# Patient Record
Sex: Female | Born: 1980 | Hispanic: Yes | Marital: Single | Smoking: Never smoker
Health system: Southern US, Community
[De-identification: ages and names within clinical notes are randomized; demographics above are authoritative.]

---

## 2008-08-17 ENCOUNTER — Encounter: Admission: RE | Admit: 2008-08-17 | Discharge: 2008-11-02 | Payer: Self-pay | Admitting: Family Medicine

## 2008-08-17 ENCOUNTER — Ambulatory Visit: Payer: Self-pay | Admitting: Obstetrics & Gynecology

## 2008-08-24 ENCOUNTER — Ambulatory Visit: Payer: Self-pay | Admitting: Obstetrics & Gynecology

## 2008-09-07 ENCOUNTER — Ambulatory Visit: Payer: Self-pay | Admitting: Obstetrics & Gynecology

## 2008-09-07 LAB — CONVERTED CEMR LAB
HCT: 40.8 % (ref 36.0–46.0)
Hemoglobin: 12.3 g/dL (ref 12.0–15.0)
MCHC: 30.1 g/dL (ref 30.0–36.0)
MCV: 88.5 fL (ref 78.0–100.0)
Platelets: 264 10*3/uL (ref 150–400)
RDW: 14.2 % (ref 11.5–15.5)

## 2008-09-21 ENCOUNTER — Ambulatory Visit: Payer: Self-pay | Admitting: Family Medicine

## 2008-09-24 ENCOUNTER — Ambulatory Visit (HOSPITAL_COMMUNITY): Admission: RE | Admit: 2008-09-24 | Discharge: 2008-09-24 | Payer: Self-pay | Admitting: Family Medicine

## 2008-09-24 ENCOUNTER — Ambulatory Visit: Payer: Self-pay | Admitting: Family Medicine

## 2008-09-28 ENCOUNTER — Ambulatory Visit: Payer: Self-pay | Admitting: Obstetrics & Gynecology

## 2008-10-01 ENCOUNTER — Ambulatory Visit: Payer: Self-pay | Admitting: Family Medicine

## 2008-10-05 ENCOUNTER — Ambulatory Visit: Payer: Self-pay | Admitting: Obstetrics & Gynecology

## 2008-10-08 ENCOUNTER — Ambulatory Visit: Payer: Self-pay | Admitting: Family Medicine

## 2008-10-12 ENCOUNTER — Ambulatory Visit: Payer: Self-pay | Admitting: Family Medicine

## 2008-10-15 ENCOUNTER — Ambulatory Visit: Payer: Self-pay | Admitting: Obstetrics & Gynecology

## 2008-10-19 ENCOUNTER — Ambulatory Visit: Payer: Self-pay | Admitting: Obstetrics & Gynecology

## 2008-10-22 ENCOUNTER — Ambulatory Visit: Payer: Self-pay | Admitting: Obstetrics & Gynecology

## 2008-10-26 ENCOUNTER — Ambulatory Visit: Payer: Self-pay | Admitting: Obstetrics & Gynecology

## 2008-10-26 ENCOUNTER — Encounter: Payer: Self-pay | Admitting: Obstetrics & Gynecology

## 2008-10-26 LAB — CONVERTED CEMR LAB
Chlamydia, DNA Probe: NEGATIVE
GC Probe Amp, Genital: NEGATIVE

## 2008-10-27 ENCOUNTER — Encounter: Payer: Self-pay | Admitting: Obstetrics & Gynecology

## 2008-10-29 ENCOUNTER — Ambulatory Visit: Payer: Self-pay | Admitting: Obstetrics & Gynecology

## 2008-11-02 ENCOUNTER — Ambulatory Visit: Payer: Self-pay | Admitting: Obstetrics & Gynecology

## 2008-11-05 ENCOUNTER — Ambulatory Visit: Payer: Self-pay | Admitting: Obstetrics & Gynecology

## 2008-11-05 ENCOUNTER — Ambulatory Visit (HOSPITAL_COMMUNITY): Admission: RE | Admit: 2008-11-05 | Discharge: 2008-11-05 | Payer: Self-pay | Admitting: Obstetrics & Gynecology

## 2008-11-09 ENCOUNTER — Ambulatory Visit: Payer: Self-pay | Admitting: Obstetrics & Gynecology

## 2008-11-10 ENCOUNTER — Inpatient Hospital Stay (HOSPITAL_COMMUNITY): Admission: AD | Admit: 2008-11-10 | Discharge: 2008-11-12 | Payer: Self-pay | Admitting: Family Medicine

## 2008-11-10 ENCOUNTER — Ambulatory Visit: Payer: Self-pay | Admitting: Family Medicine

## 2009-06-18 IMAGING — US US OB DETAIL+14 WK
1 series · 14 of 28 positions shown · non-contrast
Comparison: none

OBSTETRICAL ULTRASOUND:
 This ultrasound exam was performed in the [HOSPITAL] Ultrasound Department.  The OB US report was generated in the AS system, and faxed to the ordering physician.  This report is also available in [REDACTED] PACS.

[Series 1: us ob detail +14 wk · 14 of 75 slices shown]
[im 3/75]
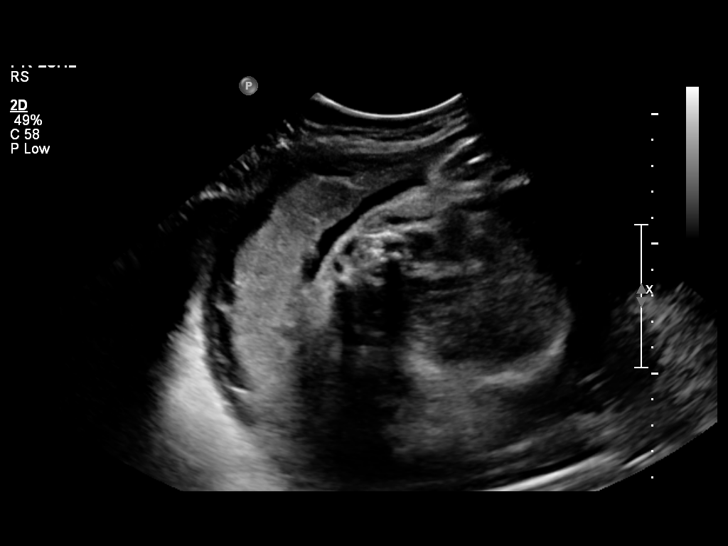
[im 9/75]
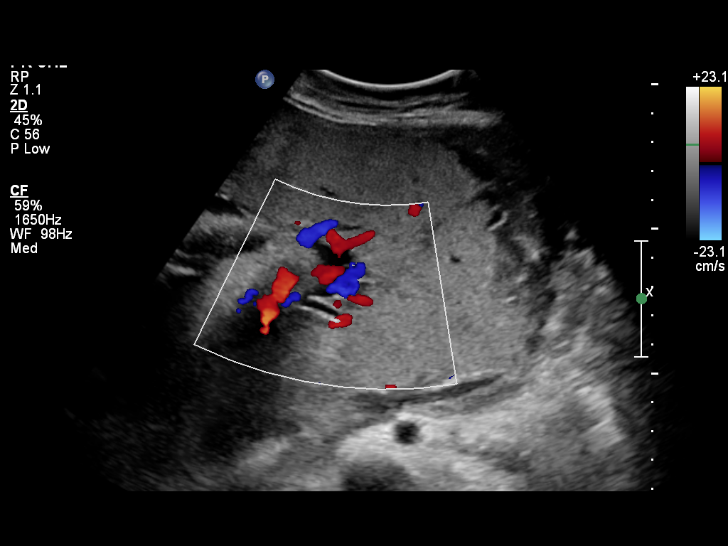
[im 14/75]
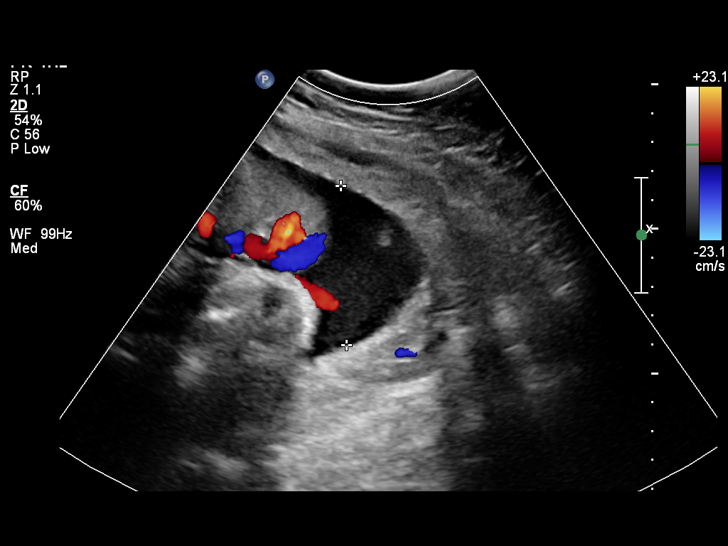
[im 20/75]
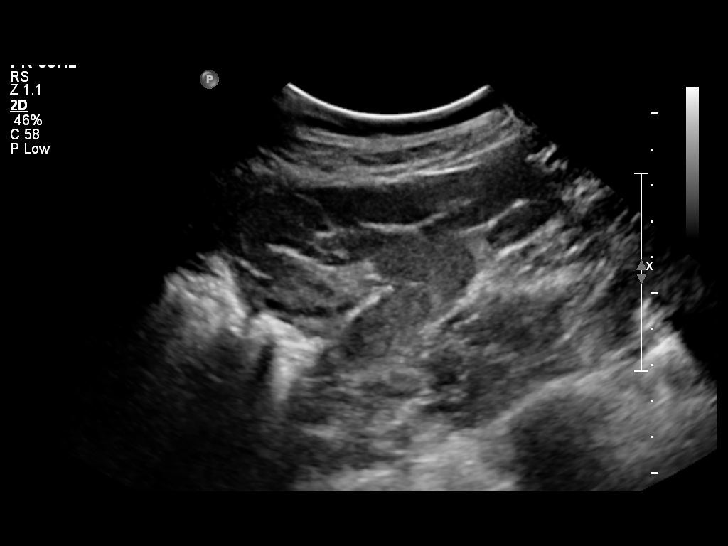
[im 25/75]
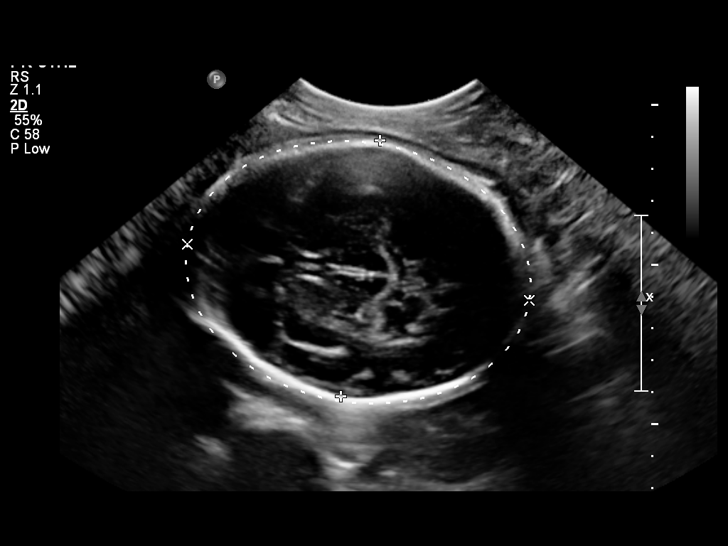
[im 31/75]
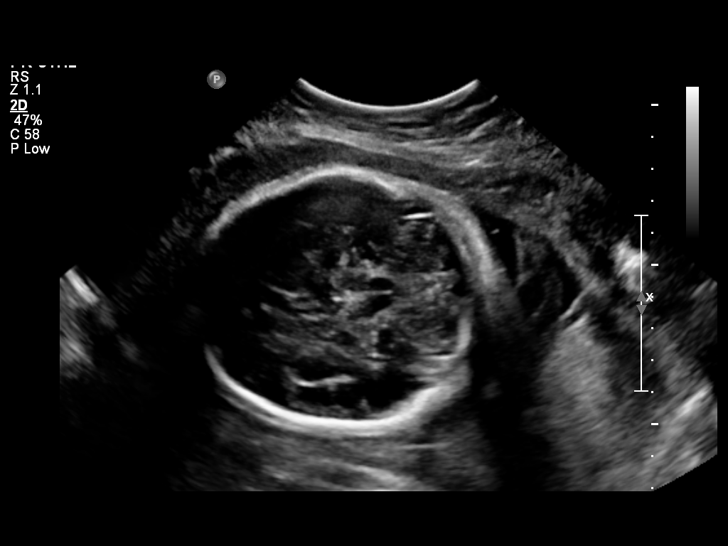
[im 36/75]
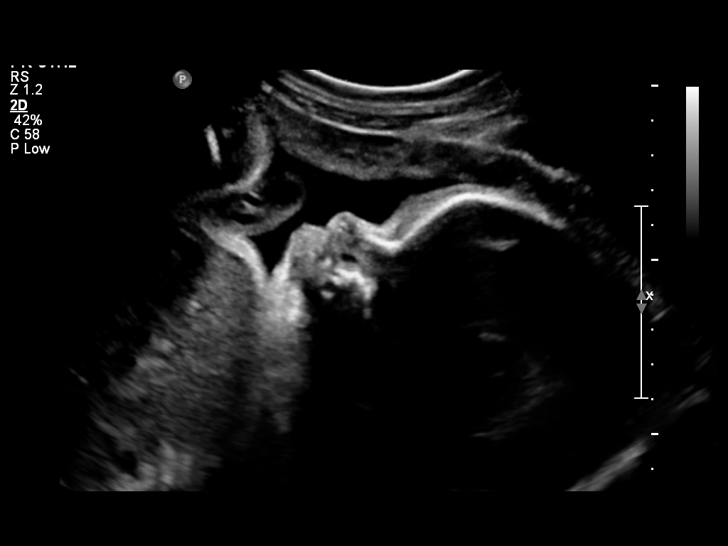
[im 42/75]
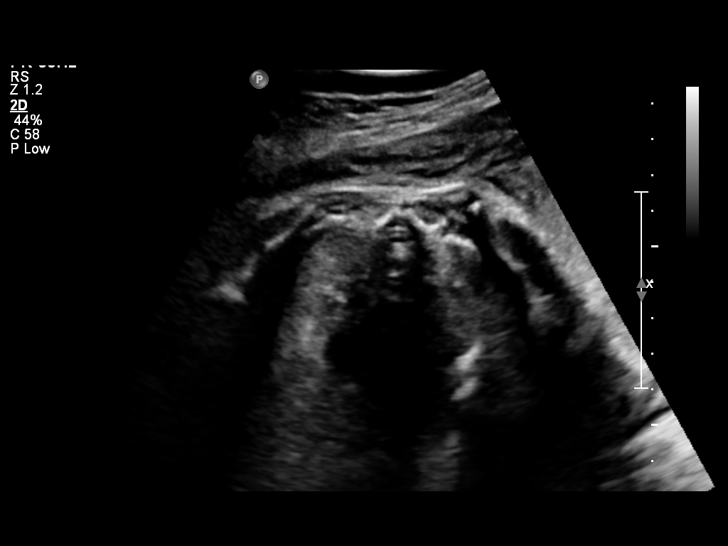
[im 47/75]
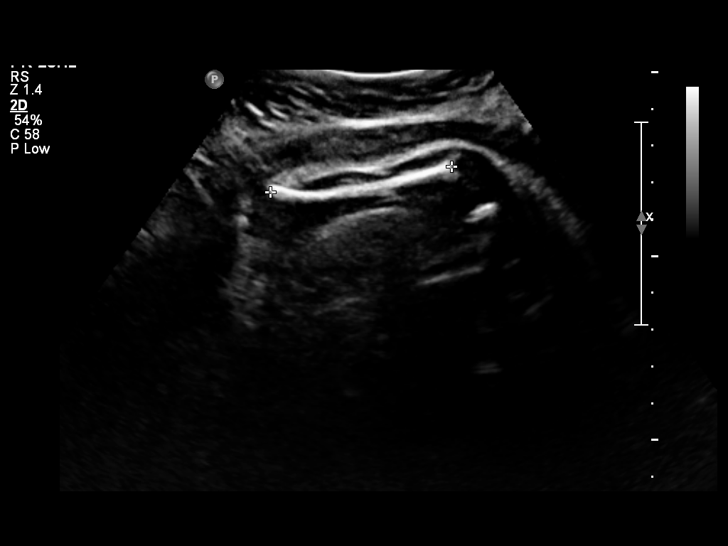
[im 53/75]
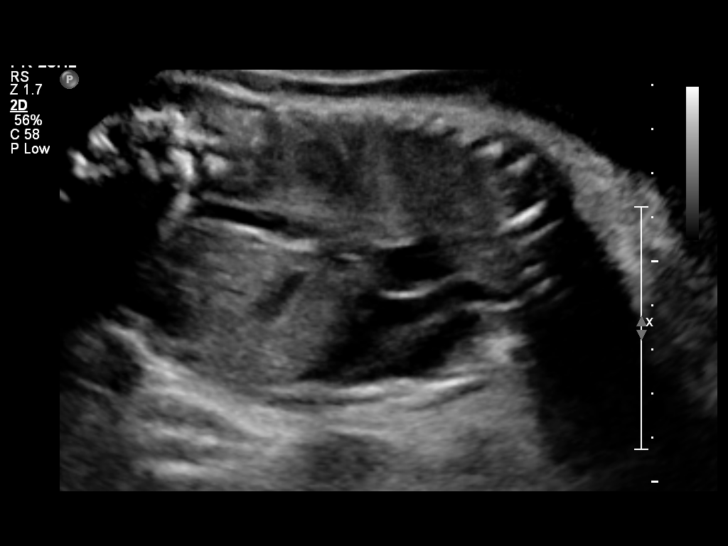
[im 58/75]
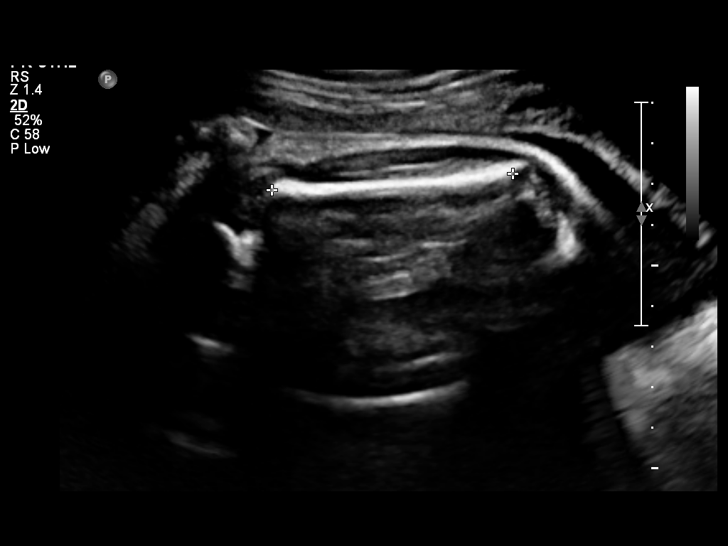
[im 64/75]
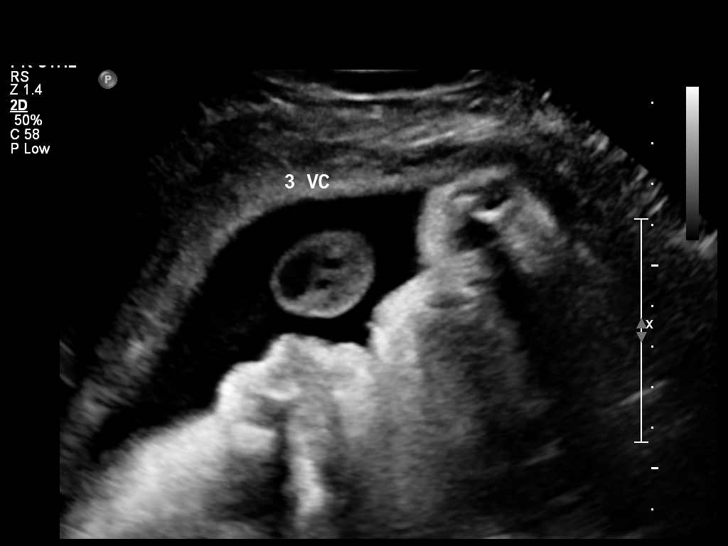
[im 69/75]
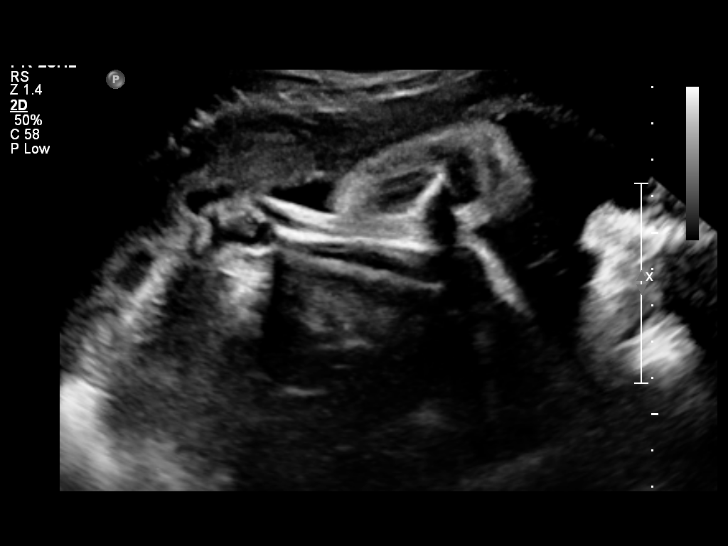
[im 75/75]
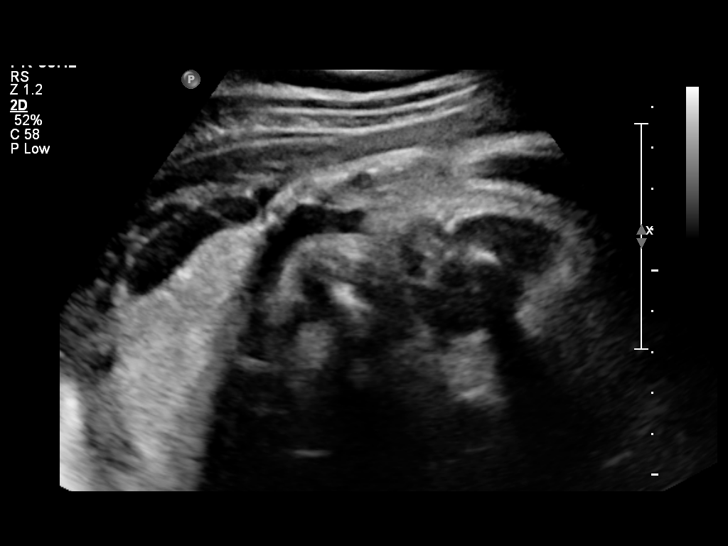

[14 of 28 positions shown; findings below may reference images not displayed]

IMPRESSION: See AS Obstetric US report.

## 2010-08-14 ENCOUNTER — Encounter: Payer: Self-pay | Admitting: *Deleted

## 2010-11-02 LAB — POCT URINALYSIS DIP (DEVICE)
Bilirubin Urine: NEGATIVE
Bilirubin Urine: NEGATIVE
Glucose, UA: NEGATIVE mg/dL
Glucose, UA: NEGATIVE mg/dL
Hgb urine dipstick: NEGATIVE
Hgb urine dipstick: NEGATIVE
Ketones, ur: NEGATIVE mg/dL
Ketones, ur: NEGATIVE mg/dL
Protein, ur: NEGATIVE mg/dL
Protein, ur: 30 mg/dL — AB
Specific Gravity, Urine: 1.02 (ref 1.005–1.030)
Specific Gravity, Urine: 1.02 (ref 1.005–1.030)
Specific Gravity, Urine: 1.025 (ref 1.005–1.030)

## 2010-11-02 LAB — GLUCOSE, CAPILLARY
Glucose-Capillary: 139 mg/dL — ABNORMAL HIGH (ref 70–99)
Glucose-Capillary: 87 mg/dL (ref 70–99)
Glucose-Capillary: 87 mg/dL (ref 70–99)

## 2010-11-02 LAB — CBC
HCT: 42.2 % (ref 36.0–46.0)
Hemoglobin: 13.7 g/dL (ref 12.0–15.0)
Platelets: 201 10*3/uL (ref 150–400)
RBC: 4.9 MIL/uL (ref 3.87–5.11)
WBC: 8.3 10*3/uL (ref 4.0–10.5)

## 2010-11-03 LAB — POCT URINALYSIS DIP (DEVICE)
Bilirubin Urine: NEGATIVE
Bilirubin Urine: NEGATIVE
Hgb urine dipstick: NEGATIVE
Hgb urine dipstick: NEGATIVE
Hgb urine dipstick: NEGATIVE
Ketones, ur: NEGATIVE mg/dL
Ketones, ur: NEGATIVE mg/dL
Ketones, ur: NEGATIVE mg/dL
Nitrite: NEGATIVE
Protein, ur: 30 mg/dL — AB
Protein, ur: NEGATIVE mg/dL
Protein, ur: NEGATIVE mg/dL
Specific Gravity, Urine: 1.02 (ref 1.005–1.030)
Specific Gravity, Urine: 1.015 (ref 1.005–1.030)
Urobilinogen, UA: 0.2 mg/dL (ref 0.0–1.0)
Urobilinogen, UA: 0.2 mg/dL (ref 0.0–1.0)
pH: 7 (ref 5.0–8.0)
pH: 7 (ref 5.0–8.0)
pH: 6.5 (ref 5.0–8.0)
pH: 7 (ref 5.0–8.0)

## 2010-11-07 LAB — POCT URINALYSIS DIP (DEVICE)
Bilirubin Urine: NEGATIVE
Glucose, UA: NEGATIVE mg/dL
Hgb urine dipstick: NEGATIVE
Ketones, ur: NEGATIVE mg/dL
Nitrite: NEGATIVE
Protein, ur: NEGATIVE mg/dL
Specific Gravity, Urine: <=1.005 (ref 1.005–1.030)
Urobilinogen, UA: 0.2 mg/dL (ref 0.0–1.0)
pH: 6 (ref 5.0–8.0)

## 2010-11-08 LAB — POCT URINALYSIS DIP (DEVICE)
Bilirubin Urine: NEGATIVE
Glucose, UA: NEGATIVE mg/dL
Hgb urine dipstick: NEGATIVE
Nitrite: NEGATIVE
Nitrite: NEGATIVE
Protein, ur: 30 mg/dL — AB
Protein, ur: NEGATIVE mg/dL
Urobilinogen, UA: 0.2 mg/dL (ref 0.0–1.0)
Urobilinogen, UA: 1 mg/dL (ref 0.0–1.0)
pH: 6 (ref 5.0–8.0)
pH: 8 (ref 5.0–8.0)

## 2017-12-14 ENCOUNTER — Emergency Department (HOSPITAL_BASED_OUTPATIENT_CLINIC_OR_DEPARTMENT_OTHER)
Admission: EM | Admit: 2017-12-14 | Discharge: 2017-12-14 | Disposition: A | Discharge status: Home / Self Care | Payer: Self-pay | Attending: Emergency Medicine | Admitting: Emergency Medicine

## 2017-12-14 ENCOUNTER — Other Ambulatory Visit: Payer: Self-pay

## 2017-12-14 ENCOUNTER — Encounter (HOSPITAL_BASED_OUTPATIENT_CLINIC_OR_DEPARTMENT_OTHER): Payer: Self-pay | Admitting: *Deleted

## 2017-12-14 ENCOUNTER — Emergency Department (HOSPITAL_BASED_OUTPATIENT_CLINIC_OR_DEPARTMENT_OTHER): Payer: Self-pay

## 2017-12-14 DIAGNOSIS — R0781 Pleurodynia: Secondary | ICD-10-CM | POA: Insufficient documentation

## 2017-12-14 DIAGNOSIS — R079 Chest pain, unspecified: Secondary | ICD-10-CM | POA: Insufficient documentation

## 2017-12-14 LAB — COMPREHENSIVE METABOLIC PANEL
ALK PHOS: 103 U/L (ref 38–126)
ALT: 35 U/L (ref 14–54)
ANION GAP: 9 (ref 5–15)
AST: 45 U/L — ABNORMAL HIGH (ref 15–41)
Albumin: 4 g/dL (ref 3.5–5.0)
BILIRUBIN TOTAL: 0.6 mg/dL (ref 0.3–1.2)
BUN: 9 mg/dL (ref 6–20)
CALCIUM: 9.2 mg/dL (ref 8.9–10.3)
CO2: 24 mmol/L (ref 22–32)
CREATININE: 0.63 mg/dL (ref 0.44–1.00)
Chloride: 103 mmol/L (ref 101–111)
GFR calc non Af Amer: >60 mL/min (ref >60)
Glucose, Bld: 155 mg/dL — ABNORMAL HIGH (ref 65–99)
Potassium: 3.8 mmol/L (ref 3.5–5.1)
Sodium: 136 mmol/L (ref 135–145)
TOTAL PROTEIN: 7.7 g/dL (ref 6.5–8.1)

## 2017-12-14 LAB — CBC WITH DIFFERENTIAL/PLATELET
BASOS ABS: 0 10*3/uL (ref 0.0–0.1)
BASOS PCT: 0 %
EOS ABS: 0.3 10*3/uL (ref 0.0–0.7)
Eosinophils Relative: 3 %
HEMATOCRIT: 39.4 % (ref 36.0–46.0)
HEMOGLOBIN: 13 g/dL (ref 12.0–15.0)
Lymphocytes Relative: 20 %
Lymphs Abs: 2 10*3/uL (ref 0.7–4.0)
MCH: 28.4 pg (ref 26.0–34.0)
MCHC: 33 g/dL (ref 30.0–36.0)
MCV: 86.2 fL (ref 78.0–100.0)
MONO ABS: 0.5 10*3/uL (ref 0.1–1.0)
Monocytes Relative: 5 %
NEUTROS ABS: 7.2 10*3/uL (ref 1.7–7.7)
NEUTROS PCT: 72 %
Platelets: 363 10*3/uL (ref 150–400)
RBC: 4.57 MIL/uL (ref 3.87–5.11)
RDW: 13.2 % (ref 11.5–15.5)
WBC: 10 10*3/uL (ref 4.0–10.5)

## 2017-12-14 LAB — PREGNANCY, URINE: Preg Test, Ur: NEGATIVE

## 2017-12-14 LAB — D-DIMER, QUANTITATIVE (NOT AT ARMC): D DIMER QUANT: 0.44 ug{FEU}/mL (ref 0.00–0.50)

## 2017-12-14 LAB — TROPONIN I

## 2017-12-14 MED ORDER — NAPROXEN 500 MG PO TABS: 500.0000 mg | ORAL_TABLET | Freq: Two times a day (BID) | ORAL | 0 refills | Status: DC

## 2017-12-14 MED FILL — NAPROXEN 500 MG TABLET: 500 | 10 days supply | Qty: 20 | Fill #0

## 2017-12-14 NOTE — ED Notes (Signed)
Pt on monitor 

## 2017-12-14 NOTE — ED Triage Notes (Addendum)
Pt co mid back pain x 1 week  Cough

## 2017-12-14 NOTE — ED Provider Notes (Signed)
[facility] EMERGENCY DEPARTMENT Provider Note   CSN: [id number] Arrival date & time: 12/14/17  1338     History   Chief Complaint Chief Complaint  Patient presents with  . Back Pain    HPI Brittany Jefferson is a 37 y.o. female.  The history is provided by the patient and a relative. No language interpreter was used.  Back Pain      Brittany Jefferson is a 37 y.o. female who presents to the Emergency Department complaining of back pain/chest pain. She reports one week of atraumatic left sided back pain and chest pain. Pain is described as a sharp and pressure likes sensation. It is constant but worse with deep breaths and worse with coughing. She denies any fevers, nausea, vomiting, abdominal pain, leg swelling or pain. No prior similar symptoms. She takes no medications and has no medical problems. No recent surgeries or travel.  History reviewed. No pertinent past medical history.  There are no active problems to display for this patient.   History reviewed. No pertinent surgical history.   OB History   None      Home Medications    Prior to Admission medications   Medication Sig Start Date End Date Taking? Authorizing Provider  naproxen (NAPROSYN) 500 MG tablet Take 1 tablet (500 mg total) by mouth 2 (two) times daily. 12/14/17   Tilden Fossa, MD    Family History History reviewed. No pertinent family history.  Social History Social History   Tobacco Use  . Smoking status: Never Smoker  . Smokeless tobacco: Never Used  Substance Use Topics  . Alcohol use: Not Currently  . Drug use: Never     Allergies   Patient has no known allergies.   Review of Systems Review of Systems  Musculoskeletal: Positive for back pain.  All other systems reviewed and are negative.    Physical Exam Updated Vital Signs BP 127/83 (BP Location: Right Arm)   Pulse 99   Temp 99.5 F (37.5 C) (Oral)   Resp 18   Wt 66.2 kg (146 lb)   LMP 12/03/2017    SpO2 97%   Physical Exam  Constitutional: She is oriented to person, place, and time. She appears well-developed and well-nourished.  HENT:  Head: Normocephalic and atraumatic.  Cardiovascular: Normal rate and regular rhythm.  No murmur heard. Pulmonary/Chest: Effort normal and breath sounds normal. No respiratory distress. She exhibits no tenderness.  Abdominal: Soft. There is no tenderness. There is no rebound and no guarding.  Musculoskeletal: She exhibits no edema or tenderness.  Neurological: She is alert and oriented to person, place, and time.  Skin: Skin is warm and dry. No rash noted.  Psychiatric: She has a normal mood and affect. Her behavior is normal.  Nursing note and vitals reviewed.    ED Treatments / Results  Labs (all labs ordered are listed, but only abnormal results are displayed) Labs Reviewed  COMPREHENSIVE METABOLIC PANEL - Abnormal; Notable for the following components:      Result Value   Glucose, Bld 155 (*)    AST 45 (*)    All other components within normal limits  PREGNANCY, URINE  CBC WITH DIFFERENTIAL/PLATELET  TROPONIN I  D-DIMER, QUANTITATIVE (NOT AT [facility])    EKG EKG Interpretation  Date/Time:  Friday Dec 14 2017 14:46:58 EDT Ventricular Rate:  86 PR Interval:    QRS Duration: 78 QT Interval:  352 QTC Calculation: 421 R Axis:   63 Text Interpretation:  Sinus rhythm  Borderline short PR interval Borderline T wave abnormalities no prior available for comparison Confirmed by Tilden Fossa [phone]) on 12/14/2017 2:50:54 PM   Radiology Dg Chest 2 View  Result Date: 12/14/2017 CLINICAL DATA:  Cough and congestion for 1 week. EXAM: CHEST - 2 VIEW COMPARISON:  None. FINDINGS: The heart size and mediastinal contours are within normal limits. Both lungs are clear. The visualized skeletal structures are unremarkable. IMPRESSION: No active cardiopulmonary disease. Electronically Signed   By: Marin Roberts M.D.   On: 12/14/2017 14:12     Procedures Procedures (including critical care time)  Medications Ordered in ED Medications - No data to display   Initial Impression / Assessment and Plan / ED Course  I have reviewed the triage vital signs and the nursing notes.  Pertinent labs & imaging results that were available during my care of the patient were reviewed by me and considered in my medical decision making (see chart for details).     Patient here for evaluation of the left sided chest and back pain. She is non-toxic appearing on examination. She is low risk for DVT and D dimer is negative. No clinical or radiographic evidence of pneumonia. Presentation is not consistent with ACS, CHF, dissection. Discussed with patient home care for pleuritic chest pain. Discussed outpatient follow-up and return precautions.  Final Clinical Impressions(s) / ED Diagnoses   Final diagnoses:  Pleuritic chest pain    ED Discharge Orders        Ordered    naproxen (NAPROSYN) 500 MG tablet  2 times daily     12/14/17 1536       Tilden Fossa, MD 12/14/17 1555

## 2017-12-14 NOTE — ED Notes (Signed)
Patient transported to X-ray 

## 2018-01-15 ENCOUNTER — Emergency Department (HOSPITAL_BASED_OUTPATIENT_CLINIC_OR_DEPARTMENT_OTHER)
Admission: EM | Admit: 2018-01-15 | Discharge: 2018-01-15 | Disposition: A | Discharge status: Home / Self Care | Payer: Self-pay | Attending: Emergency Medicine | Admitting: Emergency Medicine

## 2018-01-15 ENCOUNTER — Encounter (HOSPITAL_BASED_OUTPATIENT_CLINIC_OR_DEPARTMENT_OTHER): Payer: Self-pay

## 2018-01-15 ENCOUNTER — Other Ambulatory Visit: Payer: Self-pay

## 2018-01-15 ENCOUNTER — Emergency Department (HOSPITAL_BASED_OUTPATIENT_CLINIC_OR_DEPARTMENT_OTHER): Payer: Self-pay

## 2018-01-15 DIAGNOSIS — R05 Cough: Secondary | ICD-10-CM | POA: Insufficient documentation

## 2018-01-15 DIAGNOSIS — R079 Chest pain, unspecified: Secondary | ICD-10-CM | POA: Insufficient documentation

## 2018-01-15 DIAGNOSIS — M940 Chondrocostal junction syndrome [Tietze]: Secondary | ICD-10-CM | POA: Insufficient documentation

## 2018-01-15 MED ORDER — NAPROXEN 500 MG PO TABS: 500.0000 mg | ORAL_TABLET | Freq: Two times a day (BID) | ORAL | 0 refills | Status: AC

## 2018-01-15 MED ORDER — BENZONATATE 100 MG PO CAPS: 100.0000 mg | ORAL_CAPSULE | Freq: Three times a day (TID) | ORAL | 0 refills | Status: AC

## 2018-01-15 NOTE — ED Triage Notes (Signed)
Pt c/o pain under lt breast through to back; pain to take a deep breath

## 2018-01-15 NOTE — ED Provider Notes (Signed)
[facility] EMERGENCY DEPARTMENT Provider Note  CSN: [id number] Arrival date & time: 01/15/18  1817    History   Chief Complaint Chief Complaint  Patient presents with  . Back Pain    HPI Brittany Jefferson is a 37 y.o. female with no significant medical history who presented to the ED left sided chestbreast and back pain x3 days. Denies any recent falls, injuries or trauma to the area. Patient describes intermittent pain that is worse with movement and is pleuritic in nature. Denies chest pain, SOB, leg swelling and palpitations. Patient reports coughing excessively over the last week due to upper respiratory infection.    Additional history obtained by medical chart. Patient seen in the ED on 12/14/17 for similar complaints.   History reviewed. No pertinent past medical history.  There are no active problems to display for this patient.   History reviewed. No pertinent surgical history.   OB History   None      Home Medications    Prior to Admission medications   Medication Sig Start Date End Date Taking? Authorizing Provider  benzonatate (TESSALON) 100 MG capsule Take 1 capsule (100 mg total) by mouth 3 (three) times daily for 7 days. 01/15/18 01/22/18  Mortis, Jerrel Ivory I, PA-C  naproxen (NAPROSYN) 500 MG tablet Take 1 tablet (500 mg total) by mouth 2 (two) times daily for 14 days. 01/15/18 01/29/18  Mortis, Sharyon Medicus, PA-C    Family History No family history on file.  Social History Social History   Tobacco Use  . Smoking status: Never Smoker  . Smokeless tobacco: Never Used  Substance Use Topics  . Alcohol use: Not Currently  . Drug use: Never     Allergies   Patient has no known allergies.   Review of Systems Review of Systems  Respiratory: Positive for cough. Negative for chest tightness, shortness of breath and wheezing.   Cardiovascular: Positive for chest pain. Negative for palpitations and leg swelling.  Musculoskeletal: Positive for  back pain. Negative for neck pain and neck stiffness.  Skin: Negative.   Neurological: Negative.      Physical Exam Updated Vital Signs BP 125/75 (BP Location: Right Arm)   Pulse 89   Temp 98.9 F (37.2 C) (Oral)   Resp 18   Ht 5' (1.524 m)   Wt 64.1 kg (141 lb 6.4 oz)   LMP 12/27/2017   SpO2 100%   BMI 27.62 kg/m   Physical Exam  Constitutional: She appears well-developed and well-nourished. No distress.  Cardiovascular: Normal rate, regular rhythm, normal heart sounds and intact distal pulses.  Pulmonary/Chest: Effort normal and breath sounds normal. Chest wall is not dull to percussion. She exhibits tenderness and bony tenderness.    Musculoskeletal:       Cervical back: Normal.       Thoracic back: Normal.       Lumbar back: Normal.  Neurological: She has normal strength and normal reflexes. No sensory deficit. She exhibits normal muscle tone.  Skin: Skin is warm and intact. Capillary refill takes less than 2 seconds. No bruising and no ecchymosis noted. No erythema.  Nursing note and vitals reviewed.    ED Treatments / Results  Labs (all labs ordered are listed, but only abnormal results are displayed) Labs Reviewed - No data to display  EKG None  Radiology Dg Chest 2 View  Result Date: 01/15/2018 CLINICAL DATA:  Chest pressure x3 days EXAM: CHEST - 2 VIEW COMPARISON:  12/14/2017 FINDINGS: The heart size  and mediastinal contours are within normal limits. Both lungs are clear. The visualized skeletal structures are unremarkable. IMPRESSION: No active cardiopulmonary disease. Electronically Signed   By: Tollie Ethavid  Kwon M.D.   On: 01/15/2018 19:32    Procedures Procedures (including critical care time)  Medications Ordered in ED Medications - No data to display   Initial Impression / Assessment and Plan / ED Course  Triage vital signs and the nursing notes have been reviewed.  Pertinent labs & imaging results that were available during care of the patient  were reviewed and considered in medical decision making (see chart for details).   Patient presents well-appearing and in no acute distress. Despite endorsing chest and back pain, physical exam was unremarkable except for chest wall tenderness under the left breast. No back pain on exam with normal strength, sensation and ROM in back and all extremities.  EKG and CXR were normal which is helpful in ruling an acute cardiac or pulmonary process. Patient has minimal risk factors for DVT/PE and has Wells score of 0. Patient seen for same complaint in 11/2017. She has had no acute changes in her complaints since that visit. Given history and physical exam findings, chest pain likely MSK etiology.   Final Clinical Impressions(s) / ED Diagnoses  1. Costochondritis. Naproxen 500mg  BID for pain relief. Education provided on OTC and supportive treatment for relief. Patient encouraged to establish care with PCP to discuss non-emergent medical concerns and for follow-up with respiratory symptoms.  Dispo: Home. After thorough clinical evaluation, this patient is determined to be medically stable and can be safely discharged with the previously mentioned treatment and/or outpatient follow-up/referral(s). At this time, there are no other apparent medical conditions that require further screening, evaluation or treatment.  Final diagnoses:  Costochondritis    ED Discharge Orders        Ordered    naproxen (NAPROSYN) 500 MG tablet  2 times daily     01/15/18 1938    benzonatate (TESSALON) 100 MG capsule  3 times daily     01/15/18 1945        Mortis, Roanna RaiderGabrielle I, PA-C 01/15/18 1954    Melene PlanFloyd, Dan, DO 01/15/18 2031

## 2018-01-15 NOTE — ED Triage Notes (Signed)
States pain free while sitting, pain on movement

## 2018-01-15 NOTE — Discharge Instructions (Addendum)
Follow-up with a PCP to establish regular medical care. You may also use heat or cold packs to the painful areas for additional relief in addition to the Naproxen.

## 2018-09-07 IMAGING — DX DG CHEST 2V
2 series · 2 of 2 positions shown · non-contrast
Comparison: None.

CLINICAL DATA: Cough and congestion for 1 week.

EXAM:
CHEST - 2 VIEW

[chest pa]
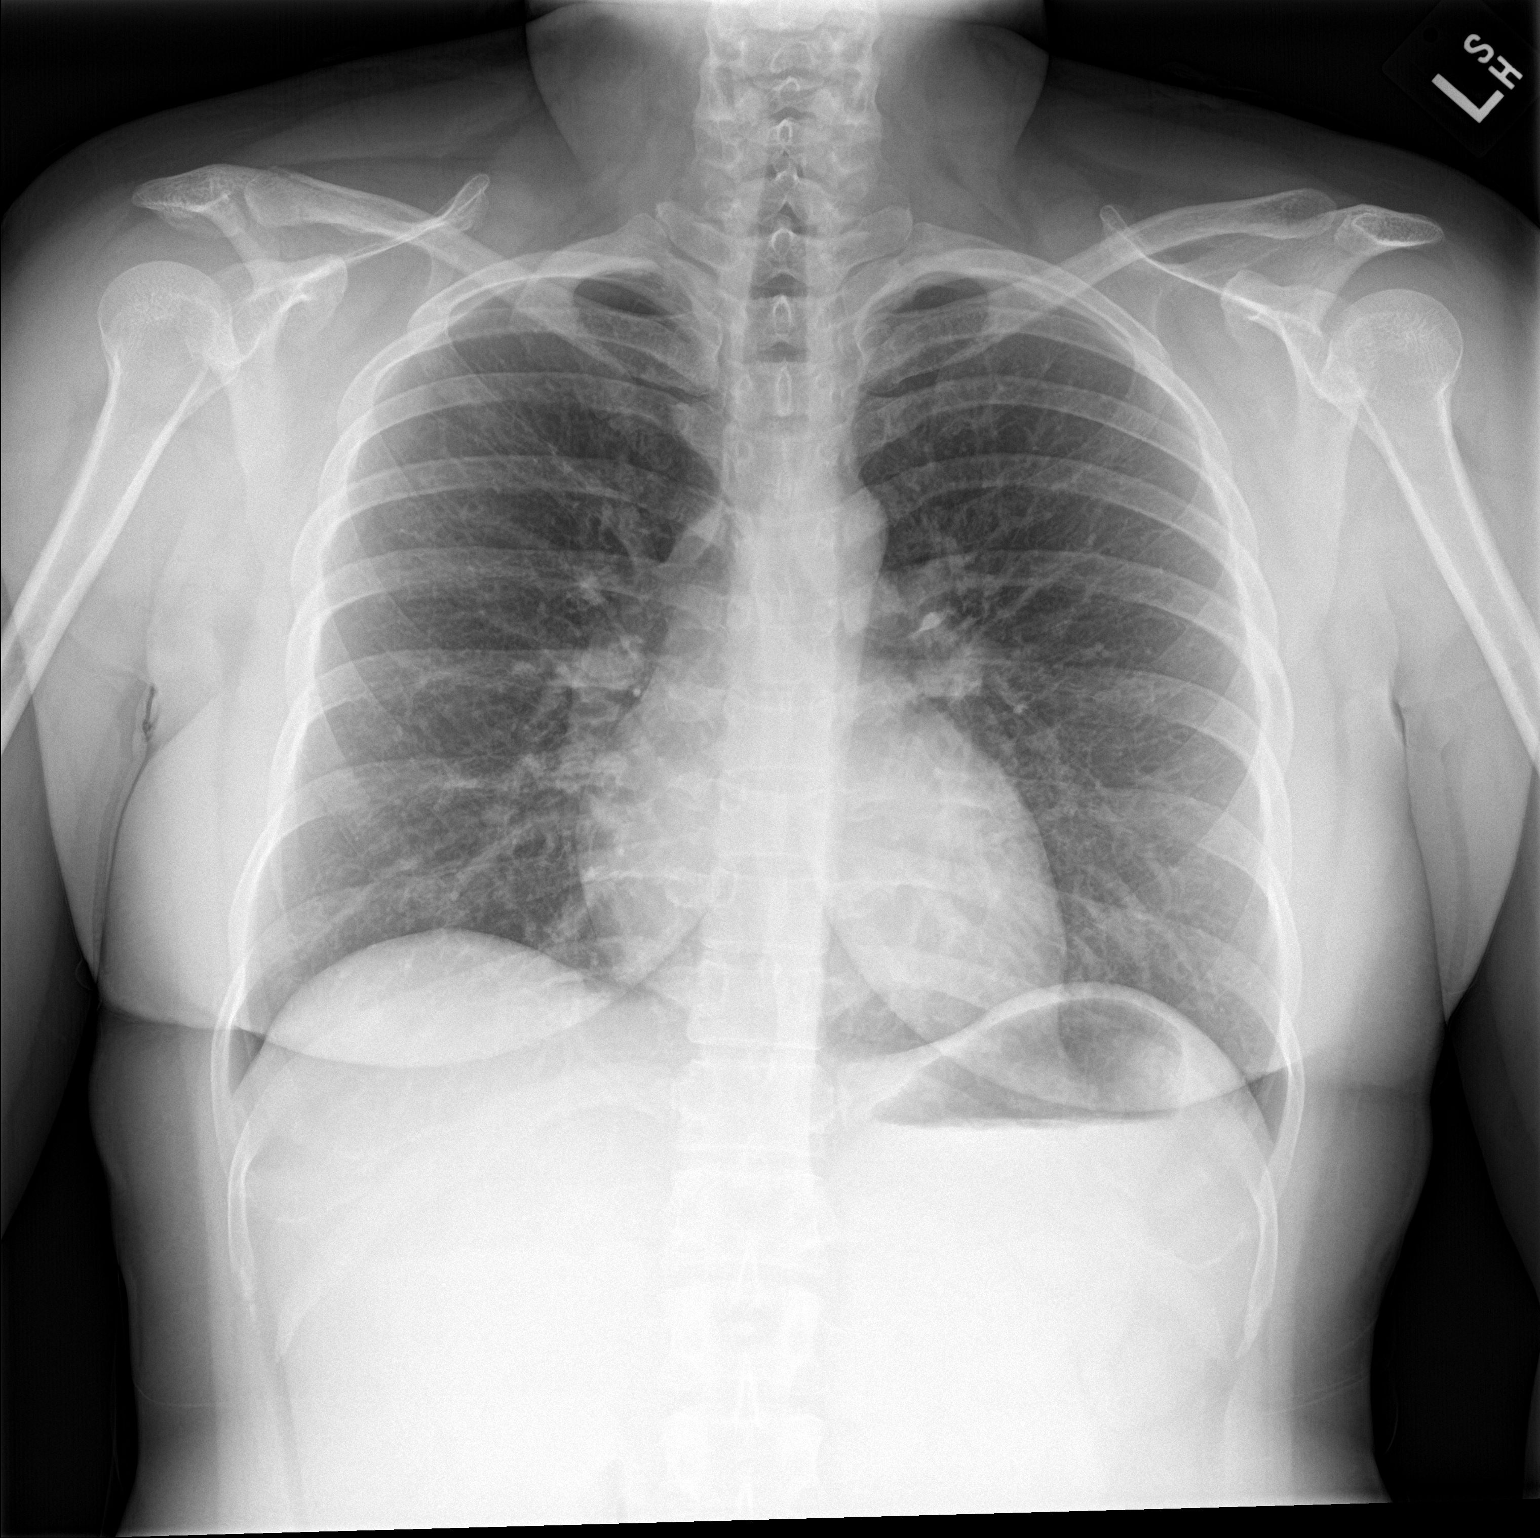

[chest lat]
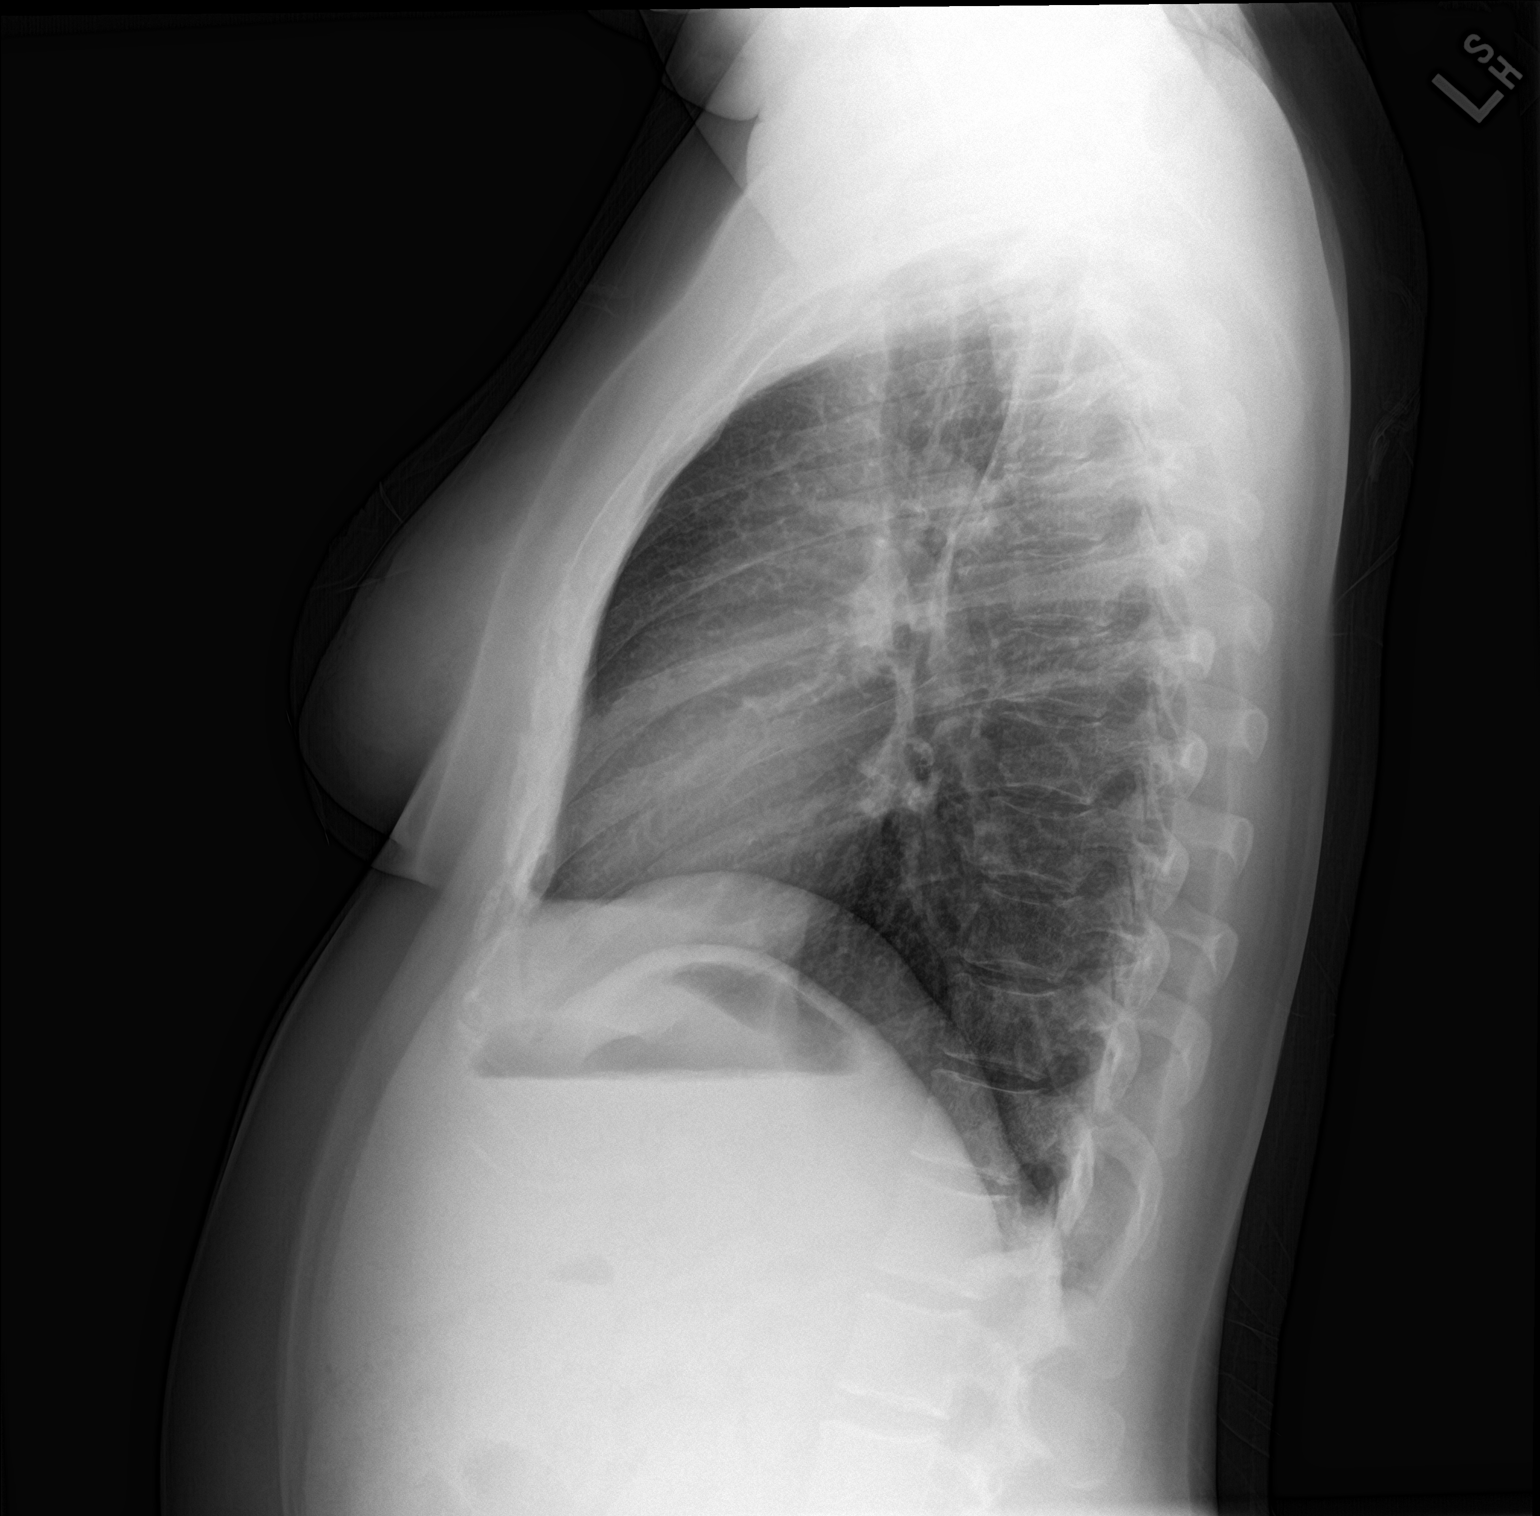

[2 of 2 positions shown; findings below may reference images not displayed]

FINDINGS: The heart size and mediastinal contours are within normal limits.
Both lungs are clear. The visualized skeletal structures are
unremarkable.
IMPRESSION: No active cardiopulmonary disease.

## 2018-10-09 IMAGING — CR DG CHEST 2V
2 series · 2 of 2 positions shown · non-contrast
Comparison: 12/14/2017

CLINICAL DATA: Chest pressure x3 days

EXAM:
CHEST - 2 VIEW

[w chest pa]
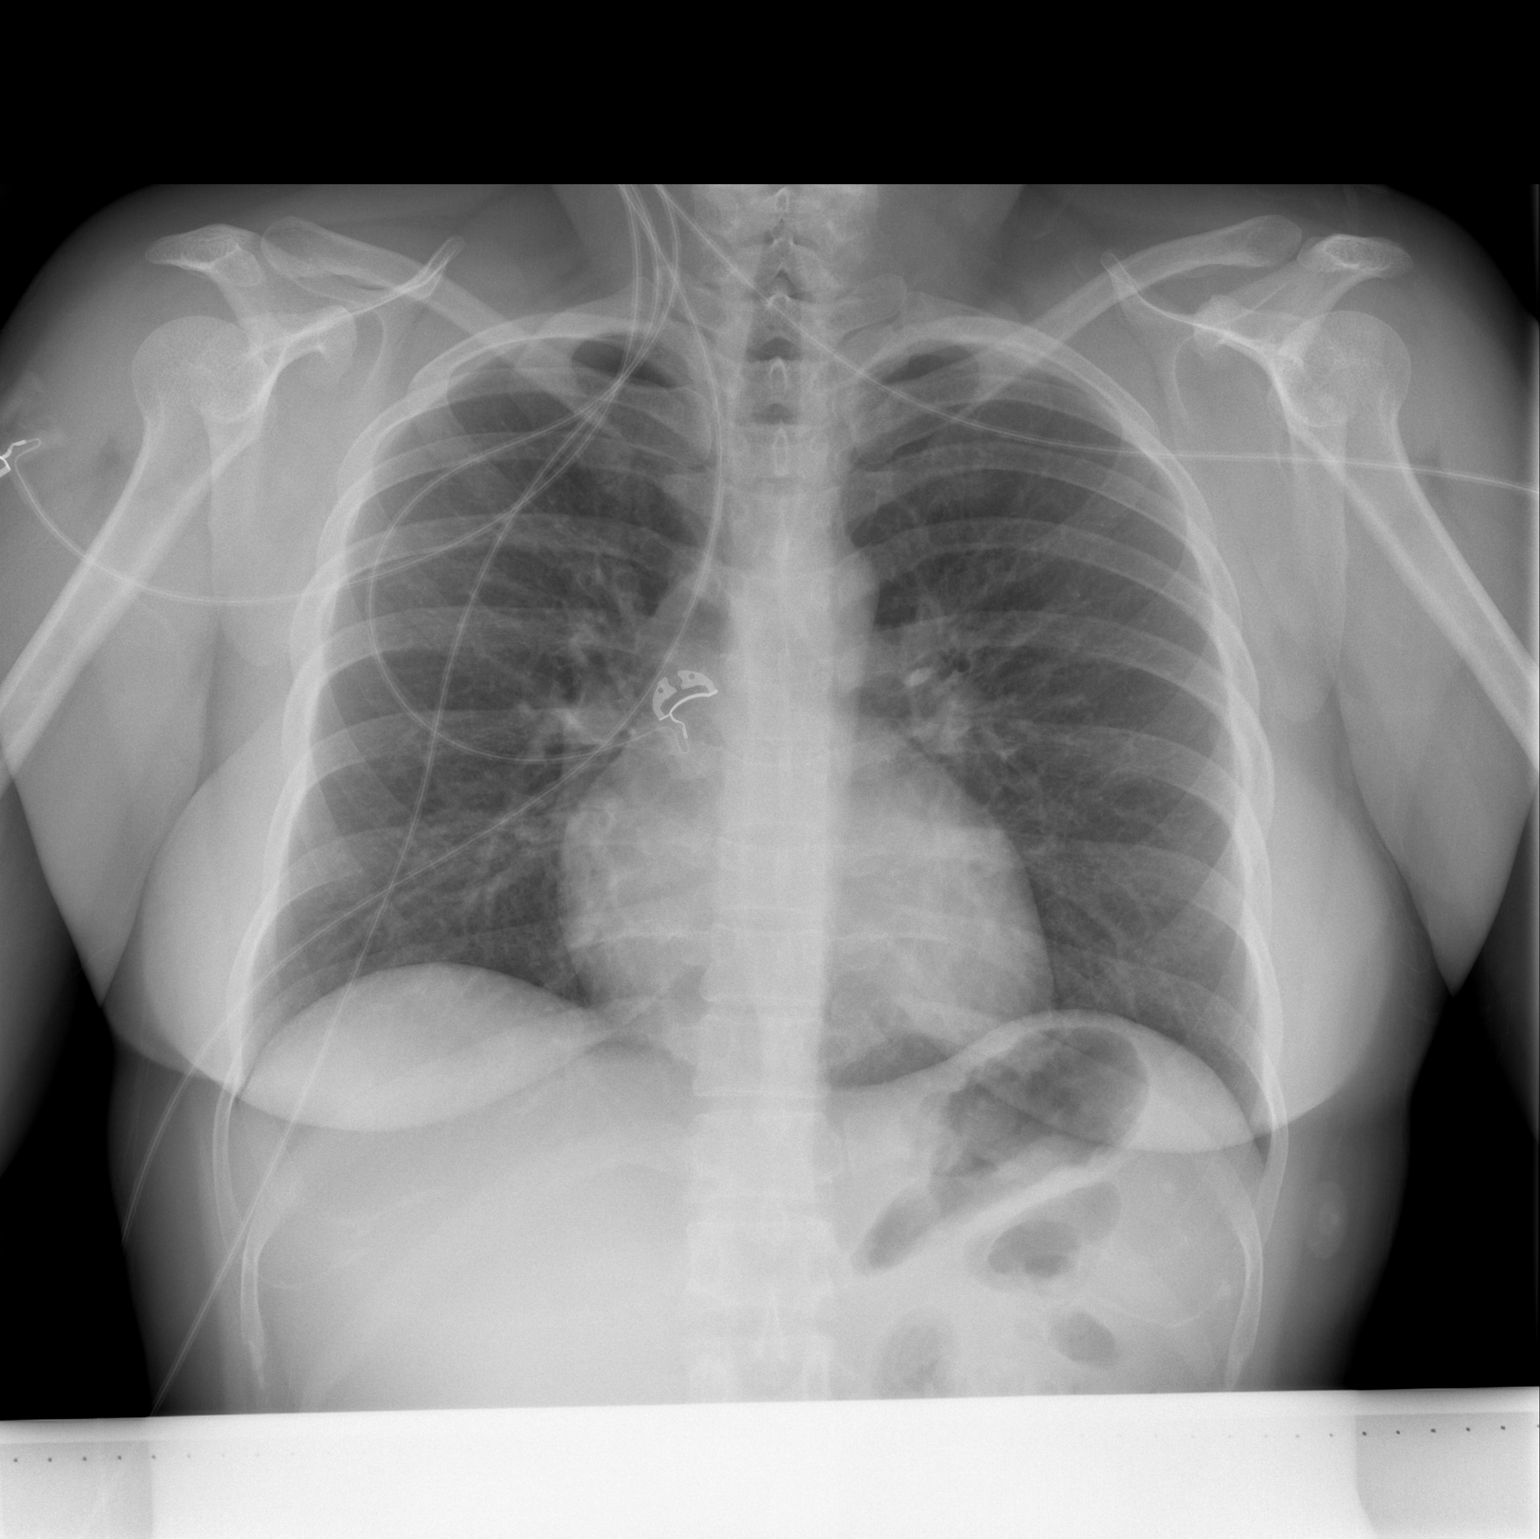

[w chest lat]
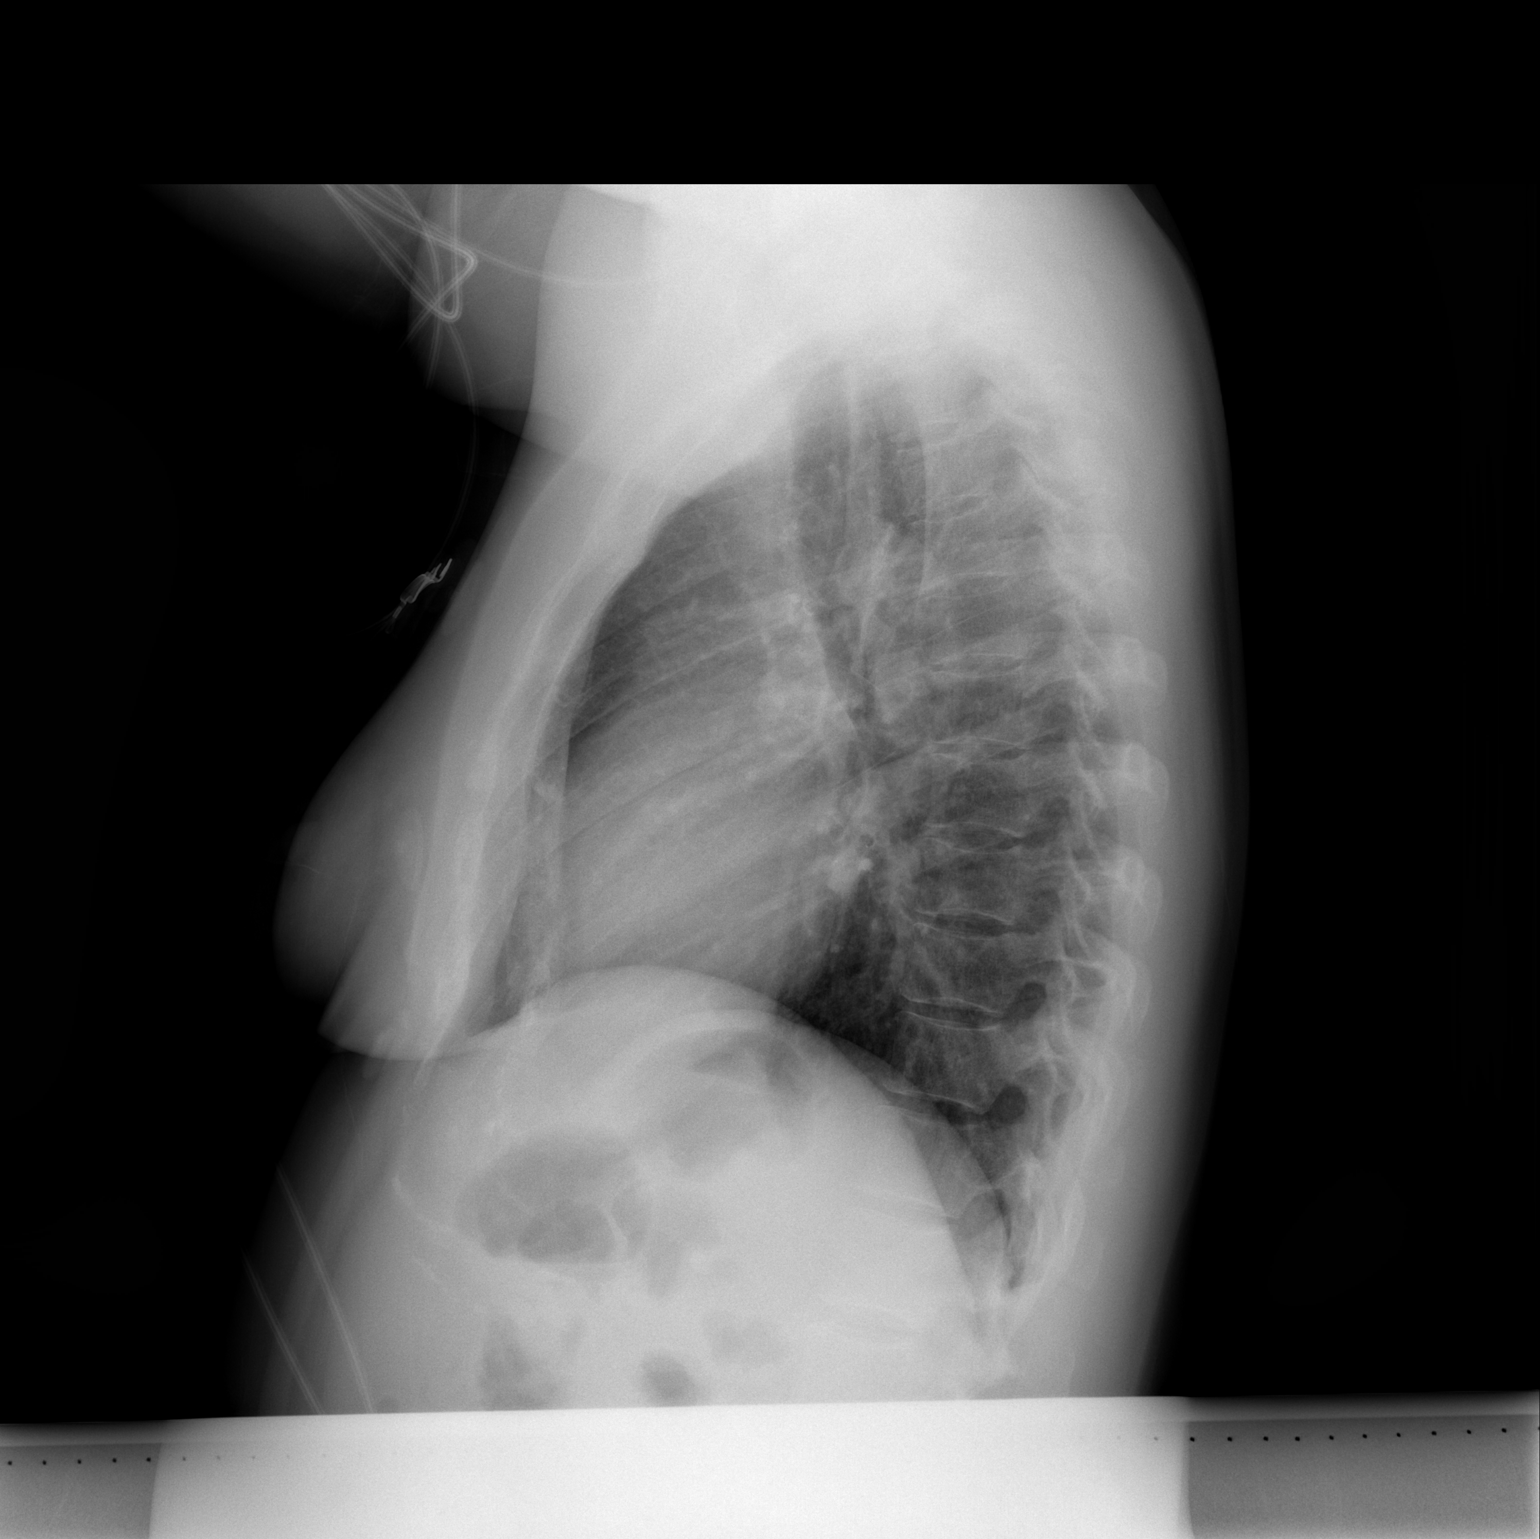

[2 of 2 positions shown; findings below may reference images not displayed]

FINDINGS: The heart size and mediastinal contours are within normal limits.
Both lungs are clear. The visualized skeletal structures are
unremarkable.
IMPRESSION: No active cardiopulmonary disease.
